# Patient Record
Sex: Male | Born: 1935 | Race: White | Hispanic: No
Health system: Southern US, Community
[De-identification: ages and names within clinical notes are randomized; demographics above are authoritative.]

---

## 2010-01-22 ENCOUNTER — Ambulatory Visit: Payer: Self-pay | Admitting: Internal Medicine

## 2010-09-11 ENCOUNTER — Ambulatory Visit: Payer: Self-pay | Admitting: Internal Medicine

## 2010-09-29 LAB — HM COLONOSCOPY

## 2011-05-04 ENCOUNTER — Ambulatory Visit: Payer: Self-pay | Admitting: Internal Medicine

## 2011-05-21 ENCOUNTER — Ambulatory Visit: Payer: Self-pay | Admitting: Gastroenterology

## 2011-05-25 LAB — PATHOLOGY REPORT

## 2011-06-15 ENCOUNTER — Ambulatory Visit: Payer: Self-pay | Admitting: Gastroenterology

## 2011-06-18 LAB — PATHOLOGY REPORT

## 2013-05-28 ENCOUNTER — Ambulatory Visit: Payer: Self-pay | Admitting: Family Medicine

## 2013-05-29 ENCOUNTER — Emergency Department: Payer: Self-pay | Admitting: Emergency Medicine

## 2013-10-25 LAB — BASIC METABOLIC PANEL
BUN: 30 mg/dL — AB (ref 4–21)
CREATININE: 1.6 mg/dL — AB (ref <=1.3)
Glucose: 100 mg/dL

## 2013-10-25 LAB — PSA: PSA: 0.7

## 2013-10-25 LAB — LIPID PANEL
CHOLESTEROL: 190 mg/dL (ref 0–200)
HDL: 87 mg/dL — AB (ref 35–70)
LDL Cholesterol: 92 mg/dL
Triglycerides: 54 mg/dL (ref 40–160)

## 2014-11-02 ENCOUNTER — Inpatient Hospital Stay: Payer: Self-pay | Admitting: Internal Medicine

## 2014-11-02 LAB — CBC
HCT: 40.1 % (ref 40.0–52.0)
HGB: 13.4 g/dL (ref 13.0–18.0)
MCH: 31.2 pg (ref 26.0–34.0)
MCHC: 33.4 g/dL (ref 32.0–36.0)
MCV: 94 fL (ref 80–100)
PLATELETS: 268 10*3/uL (ref 150–440)
RBC: 4.28 10*6/uL — AB (ref 4.40–5.90)
RDW: 13.3 % (ref 11.5–14.5)
WBC: 11.7 10*3/uL — AB (ref 3.8–10.6)

## 2014-11-02 LAB — COMPREHENSIVE METABOLIC PANEL
Albumin: 3.5 g/dL (ref 3.4–5.0)
Alkaline Phosphatase: 93 U/L (ref 46–116)
Anion Gap: 8 (ref 7–16)
BILIRUBIN TOTAL: 0.6 mg/dL (ref 0.2–1.0)
BUN: 37 mg/dL — AB (ref 7–18)
CREATININE: 1.77 mg/dL — AB (ref 0.60–1.30)
Calcium, Total: 8.4 mg/dL — ABNORMAL LOW (ref 8.5–10.1)
Chloride: 106 mmol/L (ref 98–107)
Co2: 22 mmol/L (ref 21–32)
EGFR (Non-African Amer.): 40 — ABNORMAL LOW
GFR CALC AF AMER: 48 — AB
GLUCOSE: 141 mg/dL — AB (ref 65–99)
Osmolality: 283 (ref 275–301)
Potassium: 4.8 mmol/L (ref 3.5–5.1)
SGOT(AST): 28 U/L (ref 15–37)
SGPT (ALT): 23 U/L (ref 14–63)
SODIUM: 136 mmol/L (ref 136–145)
TOTAL PROTEIN: 7.6 g/dL (ref 6.4–8.2)

## 2014-11-02 LAB — TROPONIN I
TROPONIN-I: 0.04 ng/mL
Troponin-I: 0.02 ng/mL
Troponin-I: 0.05 ng/mL

## 2014-11-02 LAB — PROTIME-INR
INR: 2.2
Prothrombin Time: 24.2 secs — ABNORMAL HIGH

## 2014-11-02 LAB — PRO B NATRIURETIC PEPTIDE: B-Type Natriuretic Peptide: 10920 pg/mL — ABNORMAL HIGH (ref 0–450)

## 2014-11-03 LAB — CBC WITH DIFFERENTIAL/PLATELET
BASOS PCT: 0.7 %
Basophil #: 0.1 10*3/uL (ref 0.0–0.1)
EOS ABS: 0.1 10*3/uL (ref 0.0–0.7)
Eosinophil %: 0.8 %
HCT: 38 % — ABNORMAL LOW (ref 40.0–52.0)
HGB: 12.7 g/dL — ABNORMAL LOW (ref 13.0–18.0)
LYMPHS PCT: 11.1 %
Lymphocyte #: 1.1 10*3/uL (ref 1.0–3.6)
MCH: 31.1 pg (ref 26.0–34.0)
MCHC: 33.5 g/dL (ref 32.0–36.0)
MCV: 93 fL (ref 80–100)
MONOS PCT: 9.7 %
Monocyte #: 1 x10 3/mm (ref 0.2–1.0)
NEUTROS ABS: 7.8 10*3/uL — AB (ref 1.4–6.5)
NEUTROS PCT: 77.7 %
Platelet: 233 10*3/uL (ref 150–440)
RBC: 4.09 10*6/uL — ABNORMAL LOW (ref 4.40–5.90)
RDW: 12.9 % (ref 11.5–14.5)
WBC: 10 10*3/uL (ref 3.8–10.6)

## 2014-11-03 LAB — BASIC METABOLIC PANEL
ANION GAP: 9 (ref 7–16)
BUN: 48 mg/dL — AB (ref 7–18)
CREATININE: 2.15 mg/dL — AB (ref 0.60–1.30)
Calcium, Total: 8.7 mg/dL (ref 8.5–10.1)
Chloride: 102 mmol/L (ref 98–107)
Co2: 25 mmol/L (ref 21–32)
EGFR (African American): 38 — ABNORMAL LOW
GFR CALC NON AF AMER: 32 — AB
Glucose: 103 mg/dL — ABNORMAL HIGH (ref 65–99)
Osmolality: 285 (ref 275–301)
Potassium: 4.3 mmol/L (ref 3.5–5.1)
Sodium: 136 mmol/L (ref 136–145)

## 2014-11-04 LAB — CREATININE, SERUM
Creatinine: 2.75 mg/dL — ABNORMAL HIGH (ref 0.60–1.30)
EGFR (African American): 29 — ABNORMAL LOW
GFR CALC NON AF AMER: 24 — AB

## 2014-11-11 ENCOUNTER — Inpatient Hospital Stay: Payer: Self-pay | Admitting: Specialist

## 2014-12-17 ENCOUNTER — Emergency Department: Payer: Self-pay | Admitting: Emergency Medicine

## 2015-01-27 NOTE — Consult Note (Signed)
PATIENT NAME:  Jaime Peters, Tobey L MR#:  [id number] DATE OF BIRTH:  06-13-1934  DATE OF CONSULTATION:  11/12/2014  REQUESTING PHYSICIAN:  Angelica Ranavid Hower, MD  CONSULTING PHYSICIAN:  Lamar BlinksBruce J. Kowalski, MD  REASON FOR CONSULTATION: Acute on chronic systolic dysfunction heart failure, chronic atrial fibrillation, chronic kidney disease needing further treatment options.   CHIEF COMPLAINT: "I'm short of breath."   HISTORY OF PRESENT ILLNESS:  This is a 81 year old male with known chronic systolic dysfunction, congestive heart failure with ejection fraction of 20% to 30% with a recent hospitalization.  The patient at that time was placed on appropriate medication management for lower extremity edema, pulmonary edema and congestive heart failure. The patient had been relatively well with treatment with carvedilol, furosemide, hydralazine and isosorbide and no ACE inhibitor was used due to concerns of chronic kidney disease. The patient therefore went home and then had some significant issues of continued lower extremity edema and shortness of breath more likely due to dietary indiscretion.  His BNP is 18,997, normal troponin without evidence of myocardial infarction.  He was placed on carvedilol for heart rate control, which appears to be relatively controlled at this time, with no evidence of significant new changes. The patient has had anticoagulation as well for further risk reduction in cardiovascular event and currently is having an INR of 2.1.  We have discussed the concerns of this 2.1 INR and whether he is taking Coumadin as well. The patient has had chronic kidney disease stage III, which is exacerbated his current issues as well.   REVIEW OF SYSTEMS: The remainder review of systems cannot be assessed due to the patient's conversation.   PAST MEDICAL HISTORY:  1.  Acute on chronic systolic dysfunction, heart failure.  2.  Chronic atrial fibrillation.  3.  Chronic kidney disease stage III.  4.   Hypertension.  5.  Mixed hyperlipidemia.   FAMILY HISTORY: Positive for shortness of breath and chest discomfort and coronary artery disease in multiple family members.   SOCIAL HISTORY: The patient currently denies alcohol or tobacco use, but has a significant history.   CURRENT MEDICATIONS:  As listed.  ALLERGIES: As listed.   PHYSICAL EXAMINATION:  VITAL SIGNS: Blood pressure is 122/68 bilateral, heart rate is 72 upright, reclining, and regular.  GENERAL: He is a well-appearing male in no acute distress.  HEENT: No icterus, thyromegaly, ulcers, hemorrhage, or xanthelasma.  CARDIOVASCULAR: Irregularly irregular. Normal S1 and S2, 2/6 apical murmur consistent with mitral regurgitation. PMI is diffuse. Carotid upstroke normal without bruit.  Jugular venous pressure is normal.  LUNGS: Have bibasilar crackles with decreased breath sounds.  ABDOMEN: Soft, nontender. Cannot assess hepatosplenomegaly or masses due to increased abdominal girth. EXTREMITIES: 2+ radial, trace femoral and dorsal pedal pulses, with 1+ lower extremity edema. No cyanosis, clubbing but some shin ulcers.  NEUROLOGIC: He is oriented to time, place, and person, with normal mood and affect.   ASSESSMENT: A 81 year old male with acute on chronic systolic dysfunction, congestive heart failure, multifactorial in nature, including dietary discretion, chronic kidney disease, and atrial fibrillation with chronic kidney disease Stage III and chronic atrial fibrillation, non-valvular in nature.   RECOMMENDATIONS:  1.  Intravenous Lasix for pulmonary edema, lower extremity edema and switch to oral Lasix when able. 2.  Continue anticoagulation for further risk reduction in stroke with chronic atrial fibrillation. The patient understands all risks and benefits of this and bleeding potential and will adjust dose based on chronic kidney disease.  3.  Further dietary evaluation  to assess for patient's dietary needs and need for  adjustments and reduce re-hospitalization.  4.   No further cardiac diagnostics at this time due to recent echocardiogram and no evidence of myocardial infarction at this time.  5.  Further ambulation and change in medication management as necessary based on above.      ____________________________ Lamar Blinks, MD bjk:DT D: 11/12/2014 08:30:22 ET T: 11/12/2014 09:23:02 ET JOB#: [id number]  cc: Lamar Blinks, MD, <Dictator> Lamar Blinks MD ELECTRONICALLY SIGNED 11/23/2014 8:30

## 2015-01-27 NOTE — H&P (Signed)
PATIENT NAME:  Jaime Peters, ELKHATIB MR#:  [id number] DATE OF BIRTH:  07-Sep-1934  DATE OF ADMISSION:  11/02/2014  REFERRING PHYSICIAN: Nez Perce Sink. Dolores Frame, MD  PRIMARY CARE PRACTITIONER: Bari Edward, MD, [facility]  PRIMARY CARDIOLOGIST: Lamar Blinks, MD   ADMITTING PHYSICIAN: Crissie Figures, MD   CHIEF COMPLAINT:  1.  Shortness of breath for the past 3 days.  2.  Productive cough for the past 3 days.   HISTORY OF PRESENT ILLNESS: A 81 year old Caucasian male with a past medical history of hypertension, chronic atrial fibrillation on Xarelto, hyperlipidemia, gastroesophageal reflux disease, renal insufficiency was in his usual state of health until 3 days ago, started having some productive cough with yellow sputum and has been noticing some increasing shortness of breath for the past 3 days. The symptoms gradually worsened with increasing shortness of breath until the last night. Hence, he called his daughter who called EMS, and EMS found the patient with shortness of breath with hypoxia and oxygen saturation of 87%. The patient was also noted to have atrial fibrillation with a rapid ventricular rate of 130 beats per minute. Hence, EMS gave Cardizem IV and gave also oxygen through nasal cannula and brought him came to the Emergency Room for further evaluation. In the Emergency Room, the patient was evaluated by the ED physician and was placed on oxygen supplementation and was still having atrial fibrillation with rapid ventricular rapid ventricular rate, was given further IV Cardizem 10 mg IV push following which he is heart rate is under control; it is stable around 105-110 beats per minute at this time. Further workup with chest x-ray with pulmonary vascular congestion and elevated BNP of 10,000. His white count is slightly elevated at 11.7. The creatinine is mildly elevated at 1.77. The patient also received  IV Lasix following which he diuresed, and he started feeling better and  currently he is on oxygen supplementation. He is comfortably resting in a chair. Denies any history of the chest pain. No fever. As mentioned, he does have some cough with expectoration of yellow sputum for the past 3 days. No nausea, no vomiting, diarrhea. No urinary symptoms.   PAST MEDICAL HISTORY:  1.  Hypertension.  2.  Chronic atrial fibrillation on Xarelto.  3.  Hyperlipidemia.  4.  Gastroesophageal reflux disease.  5.  Renal insufficiency.   PAST SURGICAL HISTORY: No history of any surgeries in the past.   ALLERGIES: No known drug allergies.   FAMILY HISTORY: Mother with congestive heart failure. Father CVA. One sister with emphysema and another sister with pancreatic cancer and son with diabetes mellitus.   SOCIAL HISTORY: He is divorced, lives alone. No history of smoking, alcohol, or drug usage.    HOME MEDICATIONS:  1.  Amlodipine/benazepril 5 mg/10 mg capsule 1 capsules once a day.  2.  Hydrochlorothiazide/triamterene 50/75 mg 1 tablet orally once a day.  3.  Lansoprazole 30 mg delayed release capsule 1 capsule orally once a day.  4.  Simvastatin 40 mg 1 tablet orally once a day.  5.  Xarelto 15 mg tablet 1 tablet orally once a day   REVIEW OF SYSTEMS:  CONSTITUTIONAL: Negative for fever. He does have some generalized fatigue with weakness for the past 2-3 days intermittently.  EYES: Negative for blurred vision, double vision. No pain. No redness. No discharge.  EARS, NOSE, AND THROAT: Negative for tinnitus, ear pain, hearing loss, epistaxis, nasal discharge.  RESPIRATORY: Positive for cough with expectoration of yellow sputum for the  past 3 days. Negative for dyspnea. Negative for wheezing. He did have some shortness of breath, as noted in the history of present illness. No hemoptysis. No painful respirations.  CARDIOVASCULAR: Negative for chest pain but positive for shortness of breath. He did have some palpitations. No dizziness. No loss of consciousness. He does have  some chronic pedal edema.  GASTROINTESTINAL: Negative for nausea, vomiting, diarrhea, abdominal pain, hematemesis, melena, or rectal bleeding. He does have some chronic GERD symptoms and takes PPI with control of symptoms.  GENITOURINARY: Negative for dysuria, frequency, urgency.  ENDOCRINE: Negative for polyuria, nocturia, heat or cold intolerance.  HEMATOLOGIC AND LYMPHATIC: Negative for anemia, easy bruising or bleeding.  INTEGUMENTARY: Negative for acne, skin rash, or lesions.  MUSCULOSKELETAL: Negative for arthritis, gout, or neck pain or back pain.  NEUROLOGICAL: Negative for focal weakness, numbness. No history of CVA, TIA, or seizure disorder.  PSYCHIATRIC: Negative for anxiety, insomnia, depression.   PHYSICAL EXAMINATION:  VITAL SIGNS: Temperature 97.8 degrees Fahrenheit; pulse rate on arrival 126 beats per minute; respirations 30 per minute; blood pressure is 127/90; oxygen saturation 87% on room air on arrival, currently at 95% on 2 liters oxygen. Heart rate is controlled at 97 per minute.  GENERAL: Well-developed, well-nourished, alert, and oriented,  no acute distress at this time, comfortably sitting in the chair.  HEAD: Atraumatic, normocephalic.  EYES: Pupils are equal, react to light and accommodation. No conjunctival pallor. Extraocular movements intact.  NOSE: No drainage. No lesions.  EARS: No drainage. No external lesions.  ORAL CAVITY: No mucosal lesions. No exudates.  NECK: Supple. No JVD. No thyromegaly. No carotid bruit. Range of motion of neck normal.  RESPIRATORY: Good respiratory effort. Not using accessory muscles of respiration. Bilateral vesicular breath sounds present, a few rales at the bases. No rhonchi.  CARDIOVASCULAR: S1, S2 irregularly irregular. No murmurs, gallops, or clicks. Peripheral pulses equal at carotid, femoral, and pedal pulses; 1+ bilateral pedal edema present.  GASTROINTESTINAL: Abdomen is soft and nontender. Obese. No hepatosplenomegaly. No  masses. No guarding. No rigidity. Bowel sounds present and equal in all 4 quadrants.  GENITOURINARY: Deferred.  MUSCULOSKELETAL: No joint tenderness or effusion. Range of motion is adequate. Strength and tone equal bilaterally.  SKIN: Inspection within normal limits.  LYMPHATIC: No cervical lymphadenopathy.  VASCULAR: 1+ dorsalis pedis and posterior tibial pulses.  NEUROLOGICAL: Alert, awake, and oriented x 3. Cranial nerves II-XII grossly intact. No sensory defect. Motor strength is 5/5 in both upper and lower extremities. DTRs 2+ bilateral and symmetrical. Plantars ongoing.  PSYCHIATRIC: Alert, awake, and oriented x 3. Judgment and insight adequate. Memory and mood within normal limits.   ANCILLARY DATA:  LABORATORY DATA: BNP 10,920. Serum glucose 141, BUN 37, creatinine 1.7, sodium 136, potassium 4.8, chloride 106, bicarbonate 22. Total calcium 8.4, total protein 7.6, albumin 3.5, total bilirubin 0.6, alkaline phosphatase 93, AST 28, ALT 23. Troponin 0.02. WBC 11.7, hemoglobin 13.4, hematocrit 40.1, platelet count 268,000. Prothrombin time 24.2, INR 2.2.   IMAGING: Chest x-ray vascular congestion noted. Increased interstitial markings with bibasilar airspace opacities raise concern for pulmonary edema. Small bilateral pleural effusions noted, more prominent on the right.   EKG: Atrial fibrillation with rapid ventricular rate of 120 beats per minute. No acute ST-T changes.   ASSESSMENT AND PLAN: A 81 year old Caucasian male with a past medical history of hypertension, chronic atrial fibrillation on Xarelto, hyperlipidemia, gastroesophageal reflux disease, renal insufficiency presents with the complaints of shortness of breath, cough with productive sputum of 3 days' duration,  found to have atrial fibrillation with rapid ventricular rate by EMS and hypoxia with oxygen saturations of 87% on room air, was given intravenous Cardizem by EMS and brought to the Emergency Room for further evaluation.  1.   Acute respiratory failure with hypoxia secondary to acute congestive heart failure, systolic versus diastolic.  2.  Acute congestive heart failure, systolic versus diastolic.  PLAN: Admit to telemetry. Oxygen supplementation, intravenous furosemide, aspirin, nitrate, beta blocker. Continue ACEI. Cycle cardiac enzymes. Echocardiogram. Cardiology consultation.  3.  Atrial fibrillation with rapid ventricular rate: History of chronic atrial fibrillation on Xarelto. The patient received intravenous Cardizem. Currently rate under control. We will start low-dose beta blocker. Monitor heart rate, follow up echocardiogram, and cardiology consult.  4.  Cough with expectoration for the past 3 days. White blood cell mild elevation. Chest x-ray pulmonary congestion, pneumonia could not be ruled out, likely acute bronchitis, rule out pneumonia. PLAN: Sputum cultures. Start intravenous antibiotics, Levaquin and follow up clinically.  5.  Hypertension: Controlled on home medications. Continue same.  6.  Hyperlipidemia: Stable on statin. Continue same.  7.  Renal insufficiency, mild: The patient is stable. Monitor clinically and monitor BMP.  8.  Chronic anticoagulation using Xarelto: Stable. Continue same.  9.  Gastroesophageal reflux disease: Stable on proton pump inhibitor. Continue same.  10.  Deep vein thrombosis prophylaxis: Xarelto. 11.  Gastrointestinal prophylaxis: Proton pump inhibitor.   CODE STATUS: Full code.   TIME SPENT: 55 minutes.    ____________________________ Crissie Figures, MD enr:bm D: 11/02/2014 03:36:30 ET T: 11/02/2014 04:17:58 ET JOB#: [id number]  cc: Crissie Figures, MD, <Dictator> Bari Edward, MD Crissie Figures MD ELECTRONICALLY SIGNED 11/02/2014 17:18

## 2015-01-27 NOTE — Discharge Summary (Signed)
PATIENT NAME:  Jaime Peters, Jaime Peters MR#:  [id number] DATE OF BIRTH:  03-13-1934  DATE OF ADMISSION:  11/11/2014 DATE OF DISCHARGE:  11/13/2014  ADMITTING DIAGNOSIS:  Shortness of breath.   DISCHARGE DIAGNOSES: 1.  Acute-on-chronic respiratory failure due to acute-on-chronic systolic congestive heart failure exacerbation.  2.  Chronic atrial fibrillation.  3.  Chronic kidney disease stage III, outpatient nephrology followup.  4.  Gastroesophageal reflux disease.  5.  Hyperlipidemia.  6.  Hypertension.  7.  Chronic respiratory failure requiring continuous oxygen.   CONSULTANTS:  Lamar BlinksBruce J. Kowalski, MD   PERTINENT LABS AND EVALUATIONS:  Admitting glucose was 116, BUN 49, creatinine 1.65, sodium 136, potassium 5.7, chloride 102, CO2 of 25, calcium 8.8. LFTs were normal except albumin of 3.1. AST was 45. Troponin was less than 0.02. WBC was 10.6, hemoglobin 11.4, platelet count 251,000. INR was 2.1. ABG:  PH of 7.34, pCO2 of 49, and pO2 of 38. EKG showed atrial fibrillation with rapid ventricular response. Chest x-ray showed worsening interstitial edema and small bilateral effusions.   HOSPITAL COURSE:  Please refer to H and P done by the admitting physician. The patient is a 81 year old white male with history of systolic CHF, presenting with shortness of breath. The patient was noted to be in acute systolic CHF. He was treated with IV Lasix. The patient started to improve. He had digoxin added to his regimen. He was also seen by his cardiologist, who recommended continuing current therapy. The patient previously was discharged on oxygen, but he was only wearing it when his saturation dropped. I strongly recommended that he wear continuous oxygen. He is also referred to the CHF clinic, and he will have home health and PT come evaluate the patient. At this time, he is doing much better and is stable for discharge. The patient stated that he wanted to go home.   DISCHARGE MEDICATIONS:  Xarelto 15 one tab  p.o. daily, simvastatin 40 at bedtime, lansoprazole 30 two daily, isosorbide mononitrate 30 daily, carvedilol 6.25 one tab p.o. b.i.d., Lasix 20 once a day, lisinopril 2.5 p.o. daily, Aldactone 25 mg 1 tab p.o. b.i.d., digoxin 125 mcg 1 tab p.o. daily.   HOME OXYGEN:  Yes, 3 liters via nasal cannula.   DIET:  Low sodium, low fat, low cholesterol.   ACTIVITY:  As tolerated.   FOLLOWUP:  With primary MD in 1 to 2 weeks, with Dr. Gwen PoundsKowalski in 1 to 2 weeks, with Dr. Thedore MinsSingh in 2 to 4 weeks, and with CHF clinic.   TIME SPENT:  35 minutes on this patient.    ____________________________ Lacie ScottsShreyang H. Allena KatzPatel, MD shp:nb D: 11/13/2014 20:53:45 ET T: 11/14/2014 05:47:51 ET JOB#: [id number]  cc: Andres Escandon H. Allena KatzPatel, MD, <Dictator> Charise CarwinSHREYANG H Ziyah Cordoba MD ELECTRONICALLY SIGNED 11/16/2014 15:56

## 2015-01-27 NOTE — Consult Note (Signed)
Chief Complaint:  Subjective/Chief Complaint Shortness of breath improve swelling improved denies any chest pain denies any palpitations or tachycardia   VITAL SIGNS/ANCILLARY NOTES: **Vital Signs.:   06-Feb-16 11:34  Vital Signs Type Routine  Temperature Temperature (F) 97.7  Celsius 36.5  Temperature Source oral  Pulse Pulse 78  Respirations Respirations 18  Systolic BP Systolic BP 135  Diastolic BP (mmHg) Diastolic BP (mmHg) 75  Mean BP 95  Pulse Ox % Pulse Ox % 95  Pulse Ox Activity Level  At rest  Oxygen Delivery 1L  *Intake and Output.:   06-Feb-16 08:29  Grand Totals Intake:  240 Output:  200    Net:  40 24 Hr.:  40  Oral Intake      In:  240  Percentage of Meal Eaten  75  Urine ml     Out:  200  Urinary Method  Void; Urinal   Brief Assessment:  GEN well developed, well nourished, no acute distress, disheveled   Cardiac Irregular  murmur present  + LE edema   Respiratory normal resp effort  rhonchi  crackles   Gastrointestinal Normal   Gastrointestinal details normal Soft   Lab Results: Routine Chem:  06-Feb-16 05:18   Glucose, Serum  103  BUN  48  Creatinine (comp)  2.15  Sodium, Serum 136  Potassium, Serum 4.3  Chloride, Serum 102  CO2, Serum 25  Calcium (Total), Serum 8.7  Anion Gap 9  Osmolality (calc) 285  eGFR (African American)  38  eGFR (Non-African American)  32 (eGFR values <60mL/min/1.73 m2 may be an indication of chronic kidney disease (CKD). Calculated eGFR, using the MRDR Study equation, is useful in  patients with stable renal function. The eGFR calculation will not be reliable in acutely ill patients when serum creatinine is changing rapidly. It is not useful in patients on dialysis. The eGFR calculation may not be applicable to patients at the low and high extremes of body sizes, pregnant women, and vegetarians.)  Routine Hem:  06-Feb-16 05:18   WBC (CBC) 10.0  RBC (CBC)  4.09  Hemoglobin (CBC)  12.7  Hematocrit (CBC)   38.0  Platelet Count (CBC) 233  MCV 93  MCH 31.1  MCHC 33.5  RDW 12.9  Neutrophil % 77.7  Lymphocyte % 11.1  Monocyte % 9.7  Eosinophil % 0.8  Basophil % 0.7  Neutrophil #  7.8  Lymphocyte # 1.1  Monocyte # 1.0  Eosinophil # 0.1  Basophil # 0.1 (Result(s) reported on 03 Nov 2014 at 06:06AM.)   Radiology Results: XRay:    05-Feb-16 01:08, Chest Portable Single View  Chest Portable Single View   REASON FOR EXAM:    SOB  COMMENTS:       PROCEDURE: DXR - DXR PORTABLE CHEST SINGLE VIEW  - Nov 02 2014  1:08AM     CLINICAL DATA:  Acute onset of chest pressure and marked shortness  of breath. Tachycardia and decreased O2 saturation. Initial  encounter.    EXAM:  PORTABLE CHEST - 1 VIEW    COMPARISON:  Chest radiograph performed 05/29/2013    FINDINGS:  The lungs are well-aerated. Vascular congestion is noted. Bilateral  increased interstitial markings, with bibasilar airspace opacities,  raiseconcern for pulmonary edema, with small bilateral pleural  effusions, more prominent on the right. No pneumothorax is seen.    The cardiomediastinal silhouette is borderline normal in size. No  acute osseous abnormalities are seen.     IMPRESSION:  Vascular congestion noted.   Increased interstitial markings, with  bibasilar airspace opacities, raise concern for pulmonary edema.  Small bilateral pleural effusions noted, more prominent on the  right.    Electronically Signed    By: Garald Balding M.D.    On: 11/02/2014 02:09         Verified By: JEFFREY . Radene Knee, M.D.,    06-Feb-16 09:30, Chest PA and Lateral  Chest PA and Lateral   REASON FOR EXAM:    chf  COMMENTS:       PROCEDURE: DXR - DXR CHEST PA (OR AP) AND LATERAL  - Nov 03 2014  9:30AM     CLINICAL DATA:  Shortness of breath and history of CHF.    EXAM:  CHEST  2 VIEW    COMPARISON:  11/02/2014    FINDINGS:  Lungs are adequately inflated with interval improvement in the  perihilar markings. There is  persistent mild bibasilar opacification  compatible with small effusions with associated atelectasis and less  likely infection. Cardiomediastinal silhouette and remainder the  exam is unchanged.     IMPRESSION:  Near complete resolution of the previous node interstitial edema.  Continued small bilateral pleural effusions likely associated  atelectasis and less likely infection in the lung bases.      Electronically Signed    By: Marin Olp M.D.    On: 11/03/2014 10:01       Verified By: Pearletha Alfred, M.D.,  Cardiology:    05-Feb-16 00:48, ECG  Ventricular Rate 119  Atrial Rate 108  QRS Duration 106  QT 318  QTc 447  R Axis -50  T Axis 90  ECG interpretation   Atrial fibrillation with rapid ventricular response with premature ventricular or aberrantly conducted complexes  Left anterior fascicular block  Septal infarct , age undetermined  Abnormal ECG  No previous ECGs available  ----------unconfirmed----------  Confirmed by OVERREAD, NOT ([id number]), editor PEARSON, BARBARA ([id number]) on 11/02/2014 1:56:33 PM  ECG     05-Feb-16 08:28, Echo Doppler  Echo Doppler   REASON FOR EXAM:      COMMENTS:       PROCEDURE: [facility] - ECHO DOPPLER COMPLETE(TRANSTHOR)  - Nov 02 2014  8:28AM     RESULT: Echocardiogram Report    Patient Name:   Jaime Peters Date of Exam: 11/02/2014  Medical Rec #:  [id number]           Custom1:  Date of Birth:  [birth year]        Height:       68.0 in  Patient Age:    81 years         Weight:       224.0 lb  Patient Gender: M                BSA:          2.14 m??    Indications: CHF  Sonographer:    Sherrie Sport RDCS  Referring Phys: Azucena Freed, N    Sonographer Comments: Technically difficult study due to poor echo   windows.    Summary:   1. Left ventricular ejection fraction, by visual estimation, is 20 to   25%.   2. Severely decreased global left ventricular systolic function.   3. Moderately increased left ventricular internal cavity size.   4.  Moderately reduced RV systolic function.   5. Moderately dilated left atrium.   6. Mildly dilated right atrium.   7. Moderate mitral valve regurgitation.  8. Mild to moderate tricuspid regurgitation.   9. Mildly increased left ventricular posterior wall thickness.  2D AND M-MODE MEASUREMENTS (normal ranges within parentheses):  Left Ventricle:          Normal  IVSd (2D):      1.03 cm (0.7-1.1)  LVPWd (2D):     1.29 cm (0.7-1.1) Aorta/LA:                  Normal  LVIDd (2D):     4.95 cm (3.4-5.7) Aortic Root (2D): 2.90 cm (2.4-3.7)  LVIDs (2D):     4.50 cm           Left Atrium (2D): 5.00 cm (1.9-4.0)  LV FS (2D):      9.1 %   (>25%)  LV EF (2D):     20.0 %   (>50%)                    Right Ventricle:                                    RVd (2D):        8.33 cm  LV DIASTOLIC FUNCTION:  MV Peak E: 1.25 m/s E/e' Ratio: 19.40                      Decel Time: 137 msec  SPECTRAL DOPPLER ANALYSIS (where applicable):  Mitral Valve:  MV P1/2 Time: 39.73 msec  MV Area, PHT: 5.54 cm??  Aortic Valve: AoV Max Vel: 0.83 m/s AoV Peak PG: 2.7 mmHg AoV Mean PG:  LVOT Vmax: 0.60 m/s LVOT VTI:  LVOT Diameter: 2.00 cm  AoV Area, Vmax: 2.27 cm?? AoV Area, VTI:  AoV Area, Vmn:  Tricuspid Valve and PA/RV Systolic Pressure: TR Max Velocity: 2.95 m/s RA   Pressure: 5 mmHg RVSP/PASP: 39.8 mmHg  Pulmonic Valve:  PV Max Velocity: 0.91 m/s PV Max PG: 3.3 mmHg PV Mean PG:    PHYSICIAN INTERPRETATION:  Left Ventricle: The left ventricular internal cavity size was moderately   increased. LV septal wall thickness was normal. LV posterior wall   thickness was mildly increased. Global LV systolic function was severely     decreased. Left ventricular ejection fraction, by visual estimation, is   20 to 25%.  Right Ventricle: The right ventricular size is mildly enlarged. Global RV   systolic function is moderately reduced.  Left Atrium: The left atrium is moderately dilated.  Right Atrium: The right atrium is  mildly dilated.  Pericardium: There is no evidence of pericardial effusion.  Mitral Valve: The mitral valve is normal in structure. Moderate mitral   valve regurgitation is seen.  Tricuspid Valve: The tricuspid valve is normal. Mild to moderate   tricuspid regurgitation is visualized. The tricuspid regurgitant velocity   is 2.95 m/s, and with an assumed right atrial pressure of 5 mmHg, the   estimated right ventricular systolic pressure is normal at 39.8 mmHg.  Aortic Valve: The aortic valve is normal. No evidence of aortic valve   regurgitation is seen.  Pulmonic Valve: The pulmonic valve is normal. Trace pulmonic valve   regurgitation.    Republic MD  Electronically signed by Kingman Lujean Amel MD  Signature Date/Time: 11/02/2014/4:02:44 PM    *** Final ***    IMPRESSION: .        Verified By: Yolonda Kida, M.D., MD  Assessment/Plan:  Assessment/Plan:  Assessment congestive heart failure  cardiomyopathy  atrial fibrillation chronic  leg edema  respiratory failure acute on chronic  bronchitis  GERD  weakness  hypoxemia  acute on chronic renal insufficiency .   Plan continue diuretic therapy for congestive heart failure symptoms  continue with beta-blockade therapy as well as ARB  consider hydralazine Imdur or for heart failure therapy  continue Xarelto for anticoagulation for AFib  continue rate control for atrial fibrillation consider antiarrhythmic  recommend support stockings for lower extremities  recommend inhalers for COPD bronchitis symptoms  supplemental oxygen for hypoxemia  physical therapy  do not recommend cardiac catheterization at this point will recommend medical therapy at this stage   Electronic Signatures: Callwood, Dwayne D (MD)  (Signed 06-Feb-16 15:56)  Authored: Chief Complaint, VITAL SIGNS/ANCILLARY NOTES, Brief Assessment, Lab Results, Radiology Results, Assessment/Plan   Last Updated: 06-Feb-16 15:56 by Callwood,  Dwayne D (MD) 

## 2015-01-27 NOTE — Discharge Summary (Signed)
PATIENT NAME:  Jaime Peters, Jaime Peters MR#:  [id number] DATE OF BIRTH:  11-Aug-1934  DATE OF ADMISSION:  11/02/2014 DATE OF DISCHARGE:  11/04/2014  PRESENTING COMPLAINT: Shortness of breath.   DISCHARGE DIAGNOSES: 1.  Acute systolic congestive heart failure.  2.  Chronic atrial fibrillation, on anticoagulation.  3.  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 4.  Hypertension.   CONDITION ON DISCHARGE: Fair. Sats 93% on 2 liters, 85% on room air with ambulation.   HOME OXYGEN: 2 liters nasal cannula, continuous.   CODE STATUS: FULL code.   DISCHARGE MEDICATIONS: 1.  Xarelto 15 mg p.o. daily.  2.  Simvastatin 40 mg daily.  3.  Lansoprazole 30 mg p.o. daily.  4.  Imdur 30 mg p.o. daily.  5.  Carvedilol 6.25 mg b.i.d.  6.  Augmentin 500/125 one tablet b.i.d.   DISCHARGE INSTRUCTIONS: 1.  The patient recommended not to take hydrochlorothiazide/triamterene and amlodipine/benazepril.  2.   Home health PT, RN.  3.  Follow up with Dr. Judithann GravesBerglund next week, Tuesday or Wednesday.  4.  Follow up with Dr. Gwen PoundsKowalski on your February appointment.  5.  The patient to see Dr. Judithann GravesBerglund to check metabolic panel on Tuesday or Wednesday. The patient's last creatinine was 2.7 on February 7th.  DIAGNOSTIC DATA: Remaining labs: H and H 10.2 and 38. Potassium 4.3.   Echo Doppler showed EF of 20% to 25%, severely decreased LVEF, moderately reduced RV systolic function, moderately dilated left atrium and right atrium.   Cardiac enzymes negative.   Chest x-ray consistent with pulmonary edema.   BNP was 10,920.   EKG showed atrial fibrillation.   CONSULTATIONS: Cardiology Dr. Juliann Paresallwood.   BRIEF SUMMARY OF HOSPITAL COURSE: Jaime Peters is a [age] year old Caucasian gentleman with history of chronic A-fib, on Coumadin, hyperlipidemia and gastric ulcer admitted with:  1.  Acute hypoxic respiratory failure secondary to acute pulmonary edema on chest x-ray, likely new onset CHF, systolic. Echo showed EF of 20% to  25%. He received IV Lasix. He diuresed well. The patient ambulated and de-satted to 85% on room air, hence oxygen was arranged. He will follow up with Dr. Gwen PoundsKowalski on his scheduled appointment.  2.  Acute on chronic renal insufficiency, unknown baseline. The patient came in with creatinine 1.7; at discharge creatinine was 2.7, likely due to overdiuresis. The patient's hydrochlorothiazide and benazepril were discontinued. He was placed on Imdur and hydralazine. He will follow up with Dr. Judithann GravesBerglund to followup with his metabolic panel. Lasix was not prescribed.  3.  Hypertension. On Imdur, hydralazine and Coreg.  4.  Acute bronchitis. Will finish a course with Augmentin.  5.  Chronic A-fib with RVR. Rate controlled with Coreg. He was continued on Xarelto.  6.  History of gastric ulcer. The patient was continued on PPI.  7.  Home health PT was arranged.   The discharge plan was discussed with the patient's daughter.   TIME SPENT: 40 minutes. ____________________________ Wylie HailSona A. Allena KatzPatel, MD sap:sb D: 11/05/2014 07:16:43 ET T: 11/05/2014 08:15:06 ET JOB#: [id number]  cc: Henretter Piekarski A. Allena KatzPatel, MD, <Dictator> Bari EdwardLaura Berglund, MD Lamar BlinksBruce J. Kowalski, MD Willow OraSONA A Dmauri Rosenow MD ELECTRONICALLY SIGNED 11/15/2014 23:26

## 2015-01-27 NOTE — Consult Note (Signed)
PATIENT NAME:  Jaime Peters, Jaime Peters DATE OF BIRTH:  1934/09/04  DATE OF CONSULTATION:  11/02/2014  REFERRING PHYSICIAN:  Crissie FiguresEdavally N. Reddy, MD  CONSULTING PHYSICIAN:  Sophy Mesler D. Juliann Paresallwood, MD  PRIMARY PHYSICIAN: Bari EdwardLaura Berglund, MD.  CARDIOLOGIST: Lamar BlinksBruce J. Kowalski, MD.   INDICATION: Congestive heart failure, shortness of breath, atrial fibrillation.  HISTORY OF PRESENT ILLNESS: Jaime Peters is a 81 year old white male with a past medical history of hypertension, chronic atrial fibrillation on Xarelto, hyperlipidemia, reflux, renal insufficiency. Usual state of health until about 3 days ago, then started having productive cough, yellow sputum, shortness of breath, dyspnea. Patient states symptoms gradually got worse until the night previous to admission he called his daughter, who called EMS. Found the patient short of breath, hypoxic saturations in the 80s. The patient was also noted to have atrial fibrillation with rapid ventricular response, rate of 130. Given IV Cardizem and oxygen and brought to the Emergency Room. Placed on supplemental oxygen. Still in atrial fibrillation with rapid ventricular response. Patient was given more Cardizem. We were able to get his rate to about 100 , vascular congestion on chest x-ray. BNP of 10,000. White count was about 12. Creatinine was slightly elevated at 1.7. Given IV Lasix and supplemental oxygen and states he had some improvement. Denied any fever, chills, or sweats. Has had yellow sputum for 2 days. No significant nausea or vomiting.    PAST MEDICAL HISTORY: Hypertension, chronic atrial fibrillation, hyperlipidemia, reflux, renal insufficiency.   PAST SURGICAL HISTORY: None.  ALLERGIES: None.   FAMILY HISTORY: Congestive heart failure, CVA, emphysema, pancreatic cancer, diabetes.   SOCIAL HISTORY: Divorced, lives alone. No smoking. No alcohol consumption.  HOME MEDICATIONS: Amlodipine and benazepril 5/10, 1 tablet a day; HCTZ and  triamterene 50/75 once a day; lansoprazole 30 mg once a day; simvastatin 40 a day; Xarelto 15 mg a day.   REVIEW OF SYSTEMS: Denies any blackouts or syncope. No nausea or vomiting. No fever. No chills. No sweats. No weight loss. No weight gain. No hemoptysis or hematemesis. No bright red blood per rectum. He is complaining of congestion, shortness of breath, sputum production, weakness, fatigue.   PHYSICAL EXAMINATION:  VITAL SIGNS: Blood pressure 130/90; pulse of 120, irregular; respiratory rate 26; afebrile.  HEENT: Normocephalic, atraumatic. Pupils equal and reactive to light.  NECK: Supple. No significant JVD, bruits or adenopathy.  LUNGS: Clear to auscultation and percussion with mild rhonchi in the bases. Faint dry crackles.  HEART: Irregularly irregular. Systolic ejection murmur at sternal border. PMI nondisplaced.  ABDOMEN: Benign.  EXTREMITY: Within normal limits except for 1 to 2+ edema. NEUROLOGIC: Intact.  SKIN: Normal.   LABORATORY DATA: BNP was 11,000. Glucose 141, BUN 37, creatinine 1.7, sodium 136, potassium 4.8, chloride 106, bicarbonate 22, total calcium 8.4, total protein  LFTs were essentially negative. Troponin 0.02. White count of 11.7, hemoglobin 15.4, hematocrit 40, platelet count 268,000. PT 24, INR 2.2. Chest x-ray showed vascular congestion, increased markings. No significant pneumonia. Small effusion on the right.   EKG: Atrial fibrillation, rapid ventricular response, rate of about 120, nonspecific finding.   ASSESSMENT: Congestive heart failure, mild respiratory failure, atrial fibrillation, cough, hypertension, hyperlipidemia, renal insufficiency, coagulopathy, reflux.   PLAN: 1.  Agree with admit. Rule out for myocardial infarction. Follow up cardiac enzymes. Follow up EKG. Consider echocardiogram. Consider diuresis. Continue current medications. 2.  Respiratory failure with hypoxemia. Recommend supplemental oxygen and inhalers. Consider pulmonary input.  Consider antibiotic therapy. Consider steroids. Continue diuretics. 3. Congestive  heart failure with systolic versus diastolic. Echocardiogram would be helpful for further evaluation and monitoring as well as tailoring care. Meanwhile, recommend an ACE inhibitor as well as beta blocker therapy and diuretics. Atrial fibrillation, continue rate control with intravenous Cardizem, low-dose beta blocker, and consider antiarrhythmic. Continue anticoagulants with Xarelto for now.  4.  For cough, recommend cough medication including expectorant. Consider antibiotic therapy. Continue inhalers. Recommend cultures for further evaluation and management. Antibiotics probably with Levaquin. 5.  Hypertension. Continue blood pressure control as necessary. For hyperlipidemia, recommend continued statin therapy.  6.  Renal insufficiency. Consider evaluation by nephrology for stage 4 renal insufficiency. Avoid nephrotoxic drugs in the meantime. 7.  Reflux. Continue proton pump inhibitor.  8.  Deep vein thrombosis prophylaxis. The patient is already on Xarelto. Will continue current therapy. Continue to watch the patient. Treat conservatively but aggressively for heart failure and respiratory failure and see how the patient responds.   ____________________________ Bobbie Stack Juliann Pares, MD ddc:ST D: 11/02/2014 20:36:51 ET T: 11/03/2014 00:17:16 ET JOB#: [id number]  cc: Shaquisha Wynn D. Juliann Pares, MD, <Dictator> Alwyn Pea MD ELECTRONICALLY SIGNED 11/06/2014 17:40

## 2015-01-27 NOTE — Consult Note (Signed)
Chief Complaint:  Subjective/Chief Complaint Shortness of breath improved 2 improve swelling still weak but feels better denies worsening palpitations or tachycardia feels well enough that   VITAL SIGNS/ANCILLARY NOTES: **Vital Signs.:   07-Feb-16 07:32  Vital Signs Type Routine  Temperature Temperature (F) 97.4  Celsius 36.3  Temperature Source oral  Pulse Pulse 76  Respirations Respirations 19  Systolic BP Systolic BP 671  Diastolic BP (mmHg) Diastolic BP (mmHg) 75  Mean BP 92  Pulse Ox % Pulse Ox % 96  Pulse Ox Activity Level  At rest  Oxygen Delivery 1L  Oxygen Delivery Adjusted To (RN or RCP Only)  Room Air/ 21 %  *Intake and Output.:   Daily 07-Feb-16 07:00  Grand Totals Intake:  720 Output:  1100    Net:  -380 24 Hr.:  -380  Oral Intake      In:  720  Urine ml     Out:  1100  Length of Stay Totals Intake:  820 Output:  4400    Net:  -3580   Brief Assessment:  GEN well developed, well nourished, no acute distress   Cardiac Irregular  murmur present  + LE edema  -- JVD   Respiratory normal resp effort  rhonchi   Gastrointestinal Normal   Gastrointestinal details normal Soft   EXTR negative cyanosis/clubbing, negative edema   Lab Results: Routine Chem:  06-Feb-16 05:18   Creatinine (comp)  2.15  eGFR (African American)  38  eGFR (Non-African American)  32 (eGFR values <34m/min/1.73 m2 may be an indication of chronic kidney disease (CKD). Calculated eGFR, using the MRDR Study equation, is useful in  patients with stable renal function. The eGFR calculation will not be reliable in acutely ill patients when serum creatinine is changing rapidly. It is not useful in patients on dialysis. The eGFR calculation may not be applicable to patients at the low and high extremes of body sizes, pregnant women, and vegetarians.)  Glucose, Serum  103  BUN  48  Sodium, Serum 136  Potassium, Serum 4.3  Chloride, Serum 102  CO2, Serum 25  Calcium (Total), Serum 8.7   Anion Gap 9  Osmolality (calc) 285  Routine Hem:  06-Feb-16 05:18   WBC (CBC) 10.0  RBC (CBC)  4.09  Hemoglobin (CBC)  12.7  Hematocrit (CBC)  38.0  Platelet Count (CBC) 233  MCV 93  MCH 31.1  MCHC 33.5  RDW 12.9  Neutrophil % 77.7  Lymphocyte % 11.1  Monocyte % 9.7  Eosinophil % 0.8  Basophil % 0.7  Neutrophil #  7.8  Lymphocyte # 1.1  Monocyte # 1.0  Eosinophil # 0.1  Basophil # 0.1 (Result(s) reported on 03 Nov 2014 at 0[facility])   Radiology Results: XRay:    05-Feb-16 01:08, Chest Portable Single View  Chest Portable Single View   REASON FOR EXAM:    SOB  COMMENTS:       PROCEDURE: DXR - DXR PORTABLE CHEST SINGLE VIEW  - Nov 02 2014  1:08AM     CLINICAL DATA:  Acute onset of chest pressure and marked shortness  of breath. Tachycardia and decreased O2 saturation. Initial  encounter.    EXAM:  PORTABLE CHEST - 1 VIEW    COMPARISON:  Chest radiograph performed 05/29/2013    FINDINGS:  The lungs are well-aerated. Vascular congestion is noted. Bilateral  increased interstitial markings, with bibasilar airspace opacities,  raiseconcern for pulmonary edema, with small bilateral pleural  effusions, more prominent on the  right. No pneumothorax is seen.    The cardiomediastinal silhouette is borderline normal in size. No  acute osseous abnormalities are seen.     IMPRESSION:  Vascular congestion noted. Increased interstitial markings, with  bibasilar airspace opacities, raise concern for pulmonary edema.  Small bilateral pleural effusions noted, more prominent on the  right.    Electronically Signed    By: Garald Balding M.D.    On: 11/02/2014 02:09         Verified By: JEFFREY . Radene Knee, M.D.,    06-Feb-16 09:30, Chest PA and Lateral  Chest PA and Lateral   REASON FOR EXAM:    chf  COMMENTS:       PROCEDURE: DXR - DXR CHEST PA (OR AP) AND LATERAL  - Nov 03 2014  9:30AM     CLINICAL DATA:  Shortness of breath and history of CHF.    EXAM:  CHEST  2  VIEW    COMPARISON:  11/02/2014    FINDINGS:  Lungs are adequately inflated with interval improvement in the  perihilar markings. There is persistent mild bibasilar opacification  compatible with small effusions with associated atelectasis and less  likely infection. Cardiomediastinal silhouette and remainder the  exam is unchanged.     IMPRESSION:  Near complete resolution of the previous node interstitial edema.  Continued small bilateral pleural effusions likely associated  atelectasis and less likely infection in the lung bases.      Electronically Signed    By: Marin Olp M.D.    On: 11/03/2014 10:01       Verified By: Pearletha Alfred, M.D.,  Cardiology:    05-Feb-16 00:48, ECG  Ventricular Rate 119  Atrial Rate 108  QRS Duration 106  QT 318  QTc 447  R Axis -50  T Axis 90  ECG interpretation   Atrial fibrillation with rapid ventricular response with premature ventricular or aberrantly conducted complexes  Left anterior fascicular block  Septal infarct , age undetermined  Abnormal ECG  No previous ECGs available  ----------unconfirmed----------  Confirmed by OVERREAD, NOT ([id number]), editor PEARSON, BARBARA ([id number]) on 11/02/2014 1:56:33 PM  ECG     05-Feb-16 08:28, Echo Doppler  Echo Doppler   REASON FOR EXAM:      COMMENTS:       PROCEDURE: [facility] - ECHO DOPPLER COMPLETE(TRANSTHOR)  - Nov 02 2014  8:28AM     RESULT: Echocardiogram Report    Patient Name:   Jaime Peters Date of Exam: 11/02/2014  Medical Rec #:  [id number]           Custom1:  Date of Birth:  19-Jun-1934        Height:       68.0 in  Patient Age:    81 years         Weight:       224.0 lb  Patient Gender: M                BSA:          2.14 m??    Indications: CHF  Sonographer:    Sherrie Sport RDCS  Referring Phys: Azucena Freed, N    Sonographer Comments: Technically difficult study due to poor echo   windows.    Summary:   1. Left ventricular ejection fraction, by visual estimation, is 20 to    25%.   2. Severely decreased global left ventricular systolic function.   3. Moderately increased left ventricular internal  cavity size.   4. Moderately reduced RV systolic function.   5. Moderately dilated left atrium.   6. Mildly dilated right atrium.   7. Moderate mitral valve regurgitation.   8. Mild to moderate tricuspid regurgitation.   9. Mildly increased left ventricular posterior wall thickness.  2D AND M-MODE MEASUREMENTS (normal ranges within parentheses):  Left Ventricle:          Normal  IVSd (2D):      1.03 cm (0.7-1.1)  LVPWd (2D):     1.29 cm (0.7-1.1) Aorta/LA:                  Normal  LVIDd (2D):     4.95 cm (3.4-5.7) Aortic Root (2D): 2.90 cm (2.4-3.7)  LVIDs (2D):     4.50 cm           Left Atrium (2D): 5.00 cm (1.9-4.0)  LV FS (2D):      9.1 %   (>25%)  LV EF (2D):     20.0 %   (>50%)                    Right Ventricle:                                    RVd (2D):        6.23 cm  LV DIASTOLIC FUNCTION:  MV Peak E: 1.25 m/s E/e' Ratio: 19.40                      Decel Time: 137 msec  SPECTRAL DOPPLER ANALYSIS (where applicable):  Mitral Valve:  MV P1/2 Time: 39.73 msec  MV Area, PHT: 5.54 cm??  Aortic Valve: AoV Max Vel: 0.83 m/s AoV Peak PG: 2.7 mmHg AoV Mean PG:  LVOT Vmax: 0.60 m/s LVOT VTI:  LVOT Diameter: 2.00 cm  AoV Area, Vmax: 2.27 cm?? AoV Area, VTI:  AoV Area, Vmn:  Tricuspid Valve and PA/RV Systolic Pressure: TR Max Velocity: 2.95 m/s RA   Pressure: 5 mmHg RVSP/PASP: 39.8 mmHg  Pulmonic Valve:  PV Max Velocity: 0.91 m/s PV Max PG: 3.3 mmHg PV Mean PG:    PHYSICIAN INTERPRETATION:  Left Ventricle: The left ventricular internal cavity size was moderately   increased. LV septal wall thickness was normal. LV posterior wall   thickness was mildly increased. Global LV systolic function was severely     decreased. Left ventricular ejection fraction, by visual estimation, is   20 to 25%.  Right Ventricle: The right ventricular size is mildly  enlarged. Global RV   systolic function is moderately reduced.  Left Atrium: The left atrium is moderately dilated.  Right Atrium: The right atrium is mildly dilated.  Pericardium: There is no evidence of pericardial effusion.  Mitral Valve: The mitral valve is normal in structure. Moderate mitral   valve regurgitation is seen.  Tricuspid Valve: The tricuspid valve is normal. Mild to moderate   tricuspid regurgitation is visualized. The tricuspid regurgitant velocity   is 2.95 m/s, and with an assumed right atrial pressure of 5 mmHg, the   estimated right ventricular systolic pressure is normal at 39.8 mmHg.  Aortic Valve: The aortic valve is normal. No evidence of aortic valve   regurgitation is seen.  Pulmonic Valve: The pulmonic valve is normal. Trace pulmonic valve   regurgitation.    [place]  Electronically signed by [phone]  Lujean Amel MD  Signature Date/Time: 11/02/2014/4:02:44 PM    *** Final ***    IMPRESSION: .        Verified By: Yolonda Kida, M.D., MD   Assessment/Plan:  Assessment/Plan:  Assessment congestive heart failure  cardiomyopathy severe systolic  hypertension  renal insufficiency acute on chronic  atrial fibrillation  hypoxemia  leg edema  bronchitis .   Plan continue heart failure therapy  discontinue ACE-inhibitor and use hydralazine and Imdur   have the patient follow-up with Nephrology for renal insufficiency  hold diuretic therapy because of worsening renal insufficiency  rate control for atrial fibrillation  continue Xarelto for anticoagulation for AFib  continue inhalers for COPD bronchitis  recommend support stockings for edema  physical therapy for rehab  patient should be acute may be discharged today for further follow-up  have the patient follow up with regular cardiologist 1-2 weeks   Electronic Signatures: Lujean Amel D (MD)  (Signed 08-Feb-16 11:23)  Authored: Chief Complaint, VITAL SIGNS/ANCILLARY  NOTES, Brief Assessment, Lab Results, Radiology Results, Assessment/Plan   Last Updated: 08-Feb-16 11:23 by Lujean Amel D (MD)

## 2015-01-27 NOTE — H&P (Signed)
PATIENT NAME:  Jaime Peters, Jaime Peters MR#:  [id number] DATE OF BIRTH:  [birth year]  DATE OF ADMISSION:  11/11/2014  PRIMARY CARE PHYSICIAN: Bari Edward, MD.  CARDIOLOGIST: Lamar Blinks, MD.   CHIEF COMPLAINT: Shortness of breath.   HISTORY OF PRESENT ILLNESS: This is a 81 year old male who presents to the hospital complaining of shortness of breath, progressively getting worse over the past 2 days. The patient was recently discharged from the hospital about a week ago and was treated for Afib and CHF while in the hospital. He now returns with worsening shortness of breath and lower extremity edema. The patient says that he could not sleep all night, as he was significantly short of breath. This morning he called his daughter, who had noticed on the phone that he was significantly out of breath. They came to see him and called EMS and he was brought to the hospital. The patient denies any chest pain. Does admit to some mild chest tightness. He admits to some mild paroxysmal nocturnal dyspnea last night. No palpitations. No syncope. No nausea, no vomiting, no abdominal pain. No other associated symptoms. The patient, when he arrived at triage, was noted to be using accessory muscles and had diffuse crackles. Chest x-ray findings are suggestive of pulmonary edema and CHF. Hospitalist services were contacted for further treatment and evaluation.   REVIEW OF SYSTEMS:  CONSTITUTIONAL: No documented fever. No weight gain or weight loss.   EYES: No blurry or double vision.   ENT: No tinnitus. No postnasal drip. No redness of the oropharynx.   RESPIRATORY: Positive cough. No wheeze. No hemoptysis. Positive dyspnea.   CARDIOVASCULAR: No chest pain, no orthopnea, no palpitations, no syncope.   GASTROINTESTINAL: No nausea, no vomiting. No diarrhea. No abdominal pain. No melena or hematochezia.   GENITOURINARY: No dysuria or hematuria.   ENDOCRINE: No polyuria, nocturia, heat or cold intolerance.    HEMATOLOGY: No anemia. No bruising. No bleeding.   INTEGUMENTARY: No rashes. No lesions.   MUSCULOSKELETAL: No arthritis. No swelling. No gout.   NEUROLOGIC: No numbness. No tingling. No ataxia. No seizure-type activity.   PSYCHIATRIC: No anxiety. No insomnia. No ADD.   PAST MEDICAL HISTORY: Consistent with hypertension, hyperlipidemia, chronic atrial fibrillation, history of CHF, chronic systolic CHF, chronic kidney disease stage 3, GERD.   ALLERGIES: No known drug allergies.   SOCIAL HISTORY: Used to be a smoker, quit many years ago. Does have a 30 pack-year smoking history. No alcohol abuse. No illicit drug abuse. Lives by himself.   FAMILY HISTORY: Mother and father both deceased. Mother died from complications of congestive heart failure. Father died from a stroke.   CURRENT MEDICATIONS: Are as follows: Coreg 6.25 mg b.i.d., hydralazine 25 mg 3 times a day, Imdur 30 mg daily, lansoprazole 30 mg daily, simvastatin 40 mg daily and Xarelto 15 mg daily.   PHYSICAL EXAMINATION: Presently is as follows:  VITAL SIGNS: Are noted to be: Temperature is 97.6, pulse 89, respirations 24, blood pressure 129/83, saturation is 99% on BiPAP.   GENERAL: He is a pleasant-appearing male in mild respiratory distress.   HEAD, EYES, EARS, NOSE, THROAT: Atraumatic, normocephalic. Extraocular muscles are intact. Pupils equal and reactive to light. Sclerae anicteric. No conjunctival injection. No pharyngeal erythema.   NECK: Supple. There is no jugular venous distention. No bruits. No lymphadenopathy. No thyromegaly.   HEART: Exam is irregular. No murmurs, no rubs, no clicks.   LUNGS: He has some coarse bibasilar crackles. Positive use of accessory muscles. No  dullness to percussion.   ABDOMEN: Soft, flat, nontender, nondistended. Has good bowel sounds. No hepatosplenomegaly appreciated.   EXTREMITIES: No evidence of cyanosis or clubbing. Does have +2 pitting edema from the knees to the ankles  bilaterally, +2 pedal and radial pulses bilaterally.   NEUROLOGIC: The patient is alert, awake and oriented x 3 with no focal motor or sensory deficits appreciated bilaterally.   SKIN: Moist and warm with no rashes appreciated.   LYMPHATIC: There is no cervical or axillary lymphadenopathy.   LABORATORY DATA: Showed a serum glucose of 116, BUN 49, creatinine 1.6, sodium 136, potassium 5.7, chloride 103, bicarbonate 25. LFTs are within normal limits. Troponin less than 0.02. White cell count 10.6, hemoglobin 11.4, hematocrit 34.5, platelet count of 251,000. INR is 2.1.   DIAGNOSTIC DATA: Chest x-ray findings showing worsening interstitial pulmonary edema and small bilateral pleural effusions.   ASSESSMENT AND PLAN: This is a 81 year old male with history of chronic atrial fibrillation, hypertension, history of congestive heart failure, gastroesophageal reflux disease, hyperlipidemia, chronic kidney disease stage 3, who presents to the hospital with shortness of breath and noted to be in congestive heart failure.  1. Acute congestive heart failure. This is likely acute on chronic systolic congestive heart failure. The patient has a history of severe cardiomyopathy, ejection fraction of 20% to 25%. The patient apparently was not on diuretics prior to being discharged a week ago. For now, I will diurese him with intravenous Lasix, follow ins and outs and daily weights. Continue his Coreg. He is not on an angiotensin converting enzyme inhibitor due to his chronic kidney disease. Continue Imdur and hydralazine. We will get a cardiology consult.  2. Chronic atrial fibrillation. The patient's rates are currently labile due to his shortness of breath. I will continue Coreg, add some as needed Cardizem for heart rates greater than 130 and continue his Xarelto.  3. Chronic kidney disease stage 3. His creatinine is close to baseline and we will follow his BUN and creatinine with diuresis. The patient likely needs  an outpatient nephrology referral.  4. Gastroesophageal reflux disease. Continue Protonix.  5. Hyperlipidemia. Continue simvastatin.  6. Hypertension. Continue Coreg, hydralazine and Imdur.   CODE STATUS: The patient is a full code.   TIME SPENT ON ADMISSION: 50 minutes.   ____________________________ Rolly PancakeVivek J. Cherlynn KaiserSainani, MD vjs:TT D: 11/11/2014 10:52:40 ET T: 11/11/2014 11:14:31 ET JOB#: [id number]  cc: Rolly PancakeVivek J. Cherlynn KaiserSainani, MD,   [place] SirenVIVEK J Sira Adsit MD ELECTRONICALLY SIGNED 11/28/2014 15:28

## 2015-01-27 DEATH — deceased

## 2015-08-01 ENCOUNTER — Other Ambulatory Visit: Payer: Self-pay | Admitting: Internal Medicine

## 2015-08-01 ENCOUNTER — Encounter: Payer: Self-pay | Admitting: Internal Medicine

## 2015-08-01 DIAGNOSIS — I482 Chronic atrial fibrillation, unspecified: Secondary | ICD-10-CM | POA: Insufficient documentation

## 2015-08-01 DIAGNOSIS — J309 Allergic rhinitis, unspecified: Secondary | ICD-10-CM | POA: Insufficient documentation

## 2015-08-01 DIAGNOSIS — Z8711 Personal history of peptic ulcer disease: Secondary | ICD-10-CM | POA: Insufficient documentation

## 2015-08-01 DIAGNOSIS — I5021 Acute systolic (congestive) heart failure: Secondary | ICD-10-CM | POA: Insufficient documentation

## 2015-08-01 DIAGNOSIS — R600 Localized edema: Secondary | ICD-10-CM | POA: Insufficient documentation

## 2015-08-01 DIAGNOSIS — D509 Iron deficiency anemia, unspecified: Secondary | ICD-10-CM | POA: Insufficient documentation

## 2015-08-01 DIAGNOSIS — I420 Dilated cardiomyopathy: Secondary | ICD-10-CM | POA: Insufficient documentation

## 2015-08-01 DIAGNOSIS — E785 Hyperlipidemia, unspecified: Secondary | ICD-10-CM | POA: Insufficient documentation

## 2015-08-01 DIAGNOSIS — I10 Essential (primary) hypertension: Secondary | ICD-10-CM | POA: Insufficient documentation

## 2015-08-01 DIAGNOSIS — F5101 Primary insomnia: Secondary | ICD-10-CM | POA: Insufficient documentation

## 2015-08-01 DIAGNOSIS — R0902 Hypoxemia: Secondary | ICD-10-CM | POA: Insufficient documentation

## 2015-08-01 DIAGNOSIS — N289 Disorder of kidney and ureter, unspecified: Secondary | ICD-10-CM | POA: Insufficient documentation

## 2015-08-01 DIAGNOSIS — M159 Polyosteoarthritis, unspecified: Secondary | ICD-10-CM | POA: Insufficient documentation

## 2015-08-01 DIAGNOSIS — I495 Sick sinus syndrome: Secondary | ICD-10-CM | POA: Insufficient documentation

## 2015-10-16 IMAGING — CR DG CHEST 1V PORT
1 series · 2 of 2 positions shown · non-contrast
Comparison: 11/03/2014

CLINICAL DATA: Shortness of breath. Recently discharged from the
hospital for congestive heart failure.

EXAM:
PORTABLE CHEST - 1 VIEW

[Series 1: ap · 0.17mm/px · 2 of 2 slices shown]
[im 1/2]
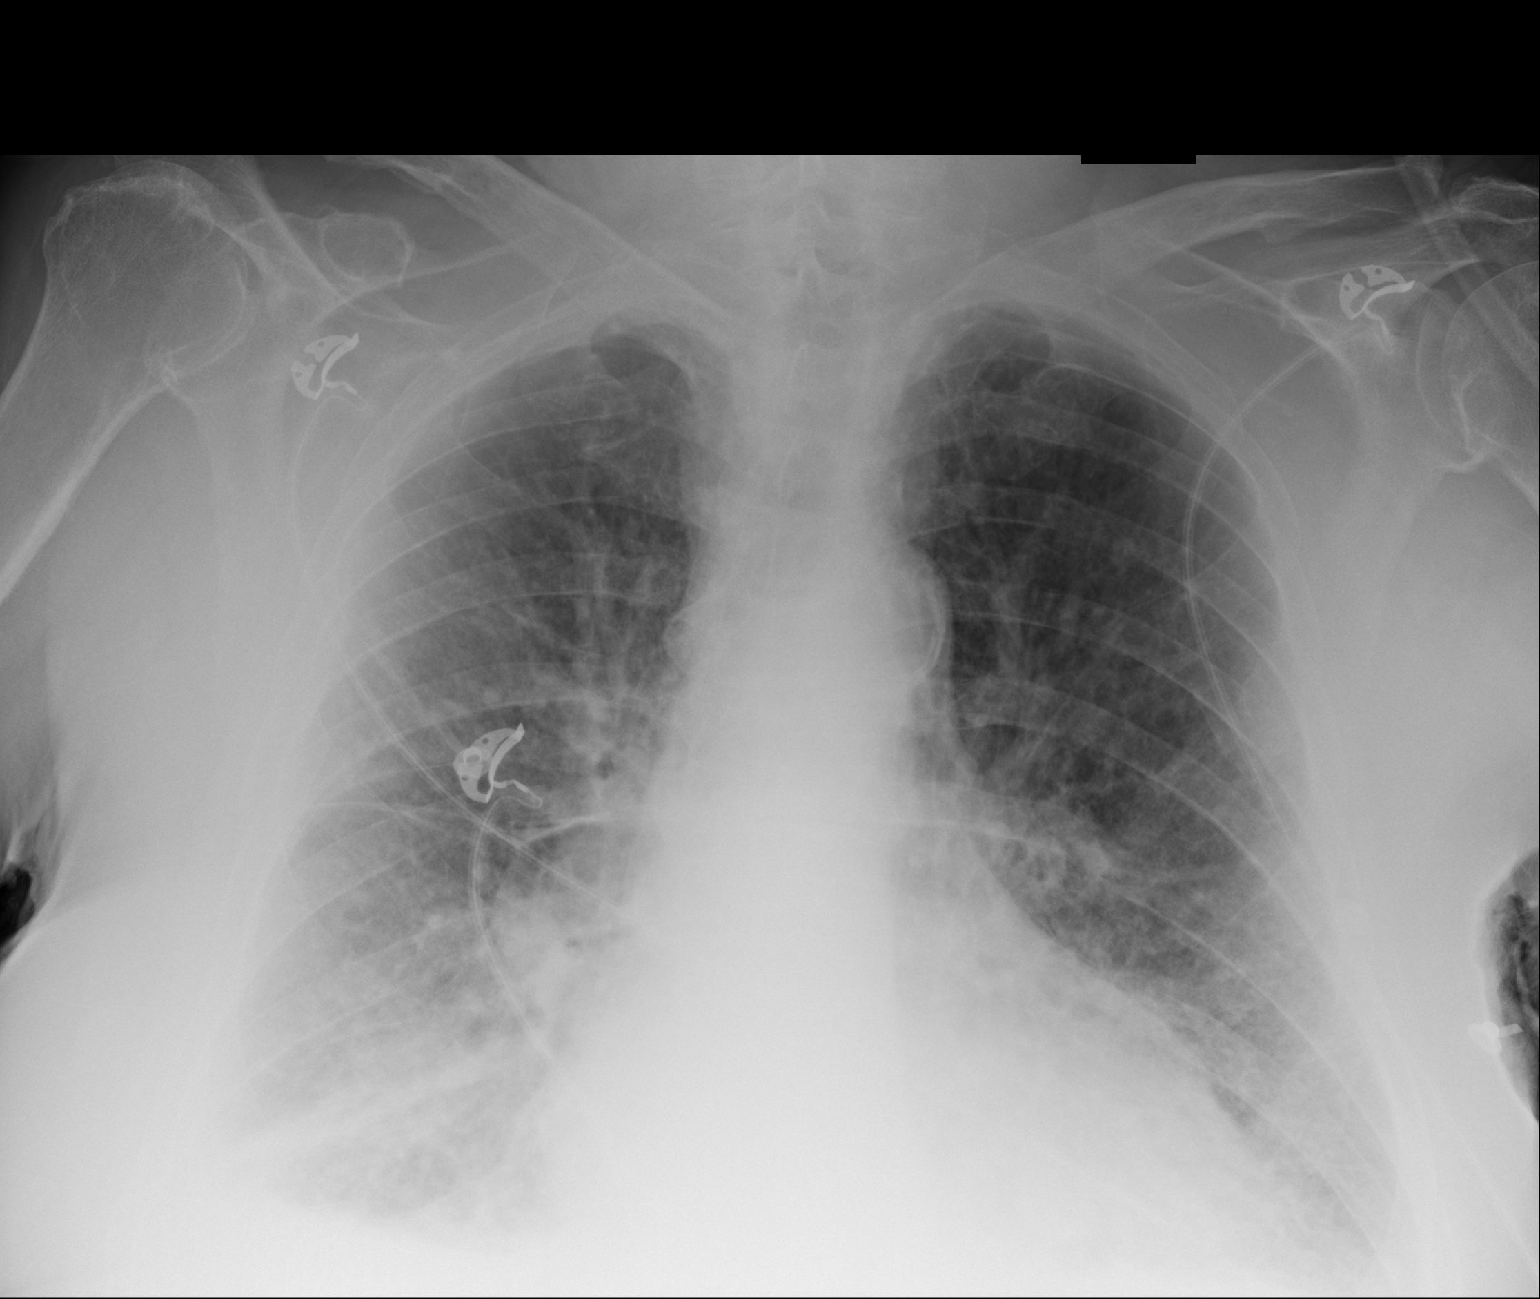
[im 2/2]
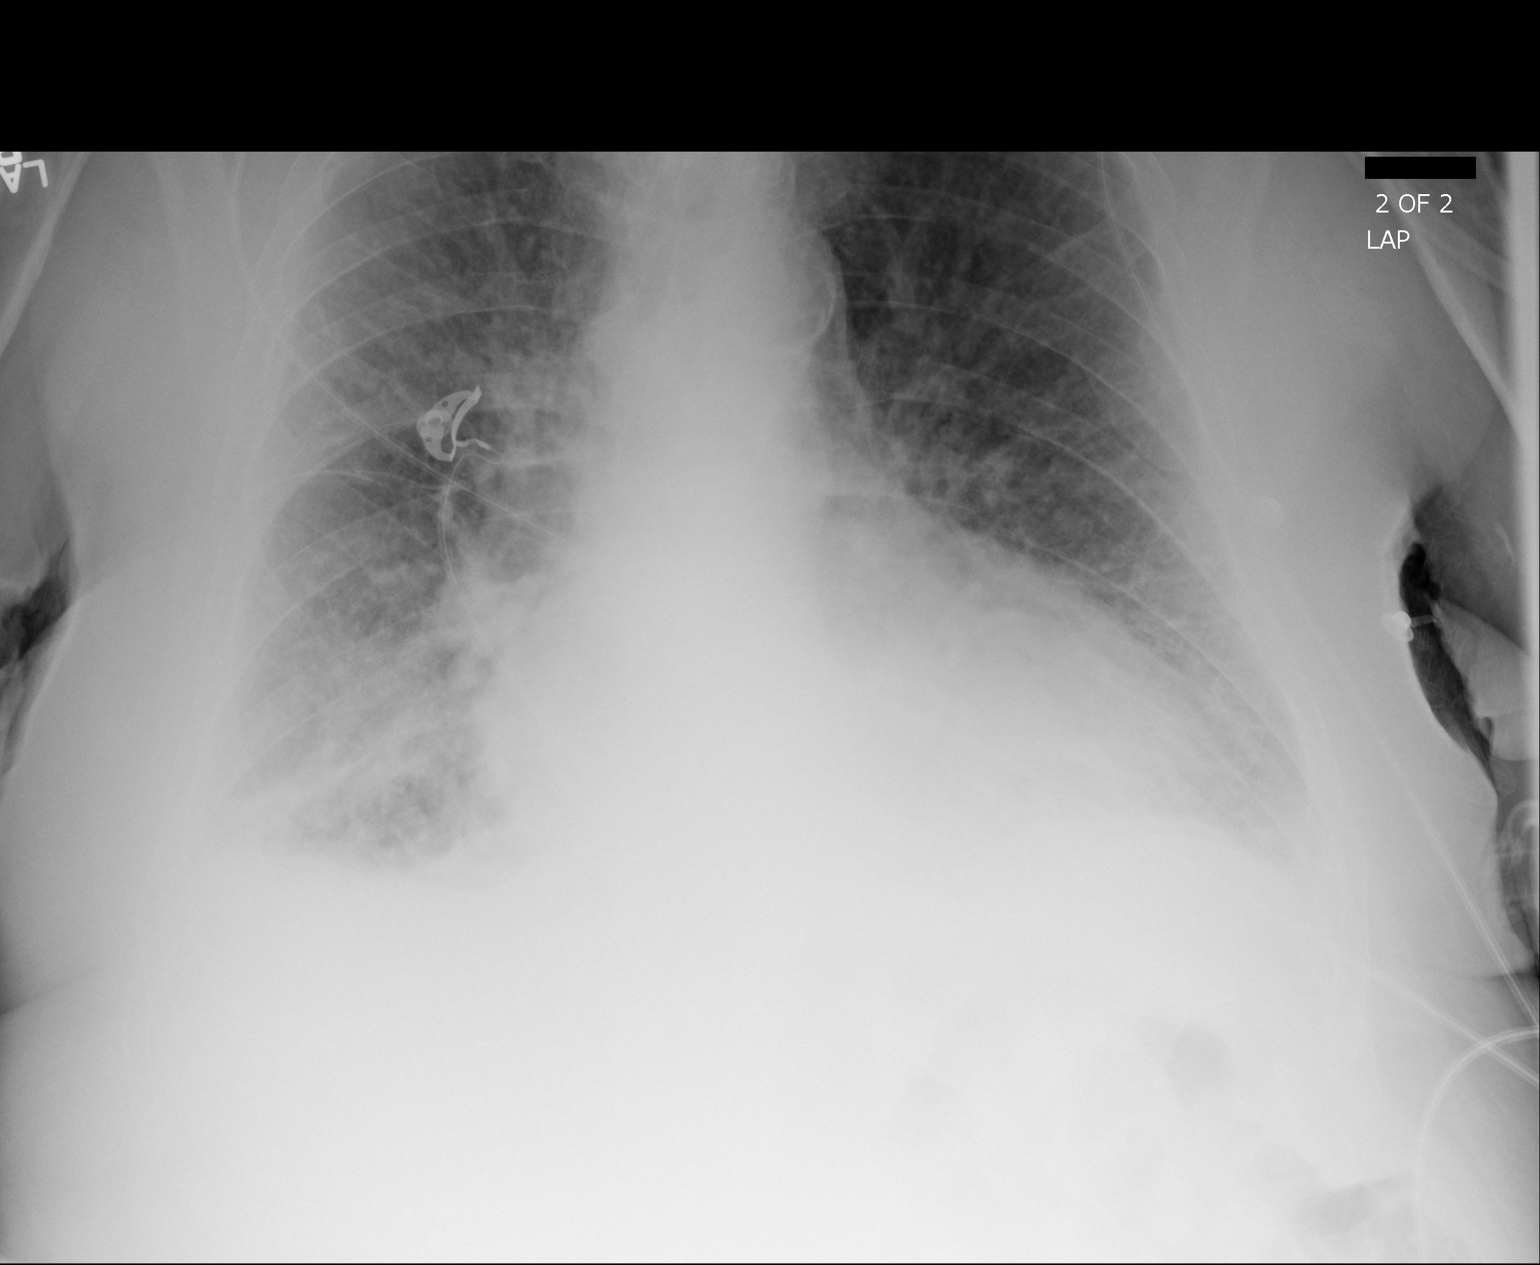

[2 of 2 positions shown; findings below may reference images not displayed]

FINDINGS: Monitoring leads overlie the patient. Stable cardiomegaly. Interval
development of bilateral mid and lower lung interstitial pulmonary
opacities. Probable small bilateral pleural effusions.
IMPRESSION: Cardiomegaly.

Worsening interstitial pulmonary edema and small bilateral pleural
effusions.

## 2015-11-21 IMAGING — CT CT HEAD WITHOUT CONTRAST
1 series · 16 of 30 positions shown, 20 images · non-contrast
Comparison: None.

CLINICAL DATA: Altered mental status.

EXAM:
CT HEAD WITHOUT CONTRAST
TECHNIQUE: Contiguous axial images were obtained from the base of the skull
through the vertex without intravenous contrast.

[Series 2: head wo · axial · 0.44mm/px · z∈[+447,+591]mm · 16 of 36 slices shown, 20 images]
[im 2/36  brain]
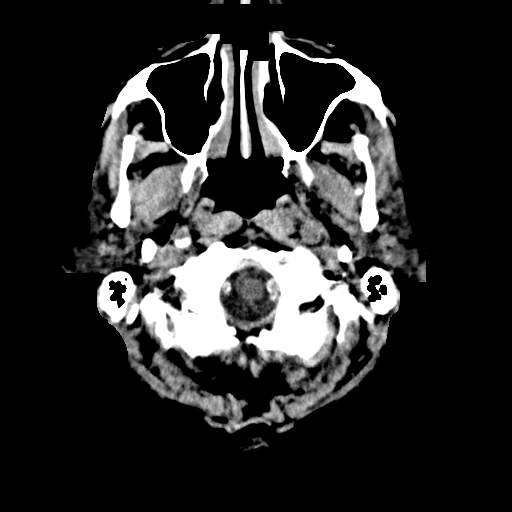
[im 2/36  bone]
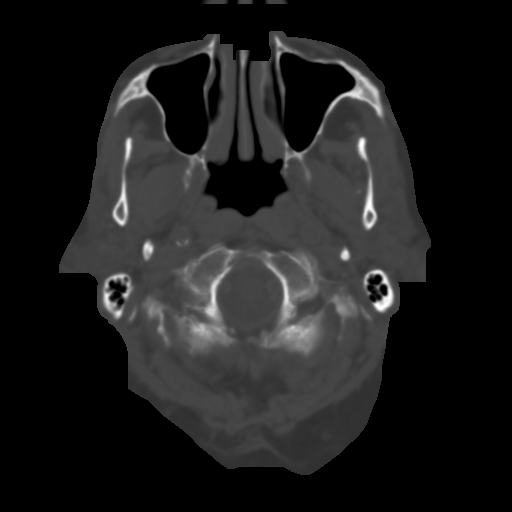
[im 4/36  brain]
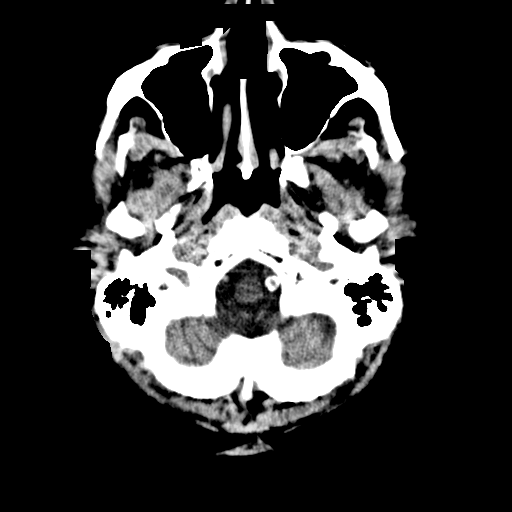
[im 7/36  brain]
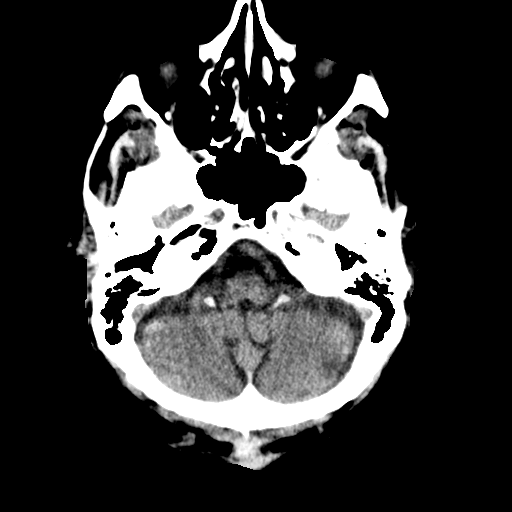
[im 9/36  brain]
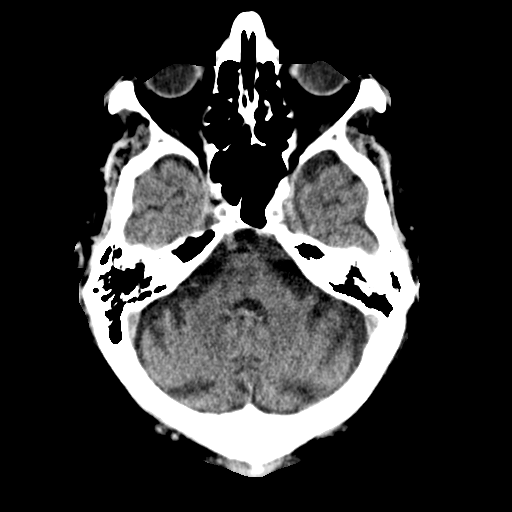
[im 10/36  brain]
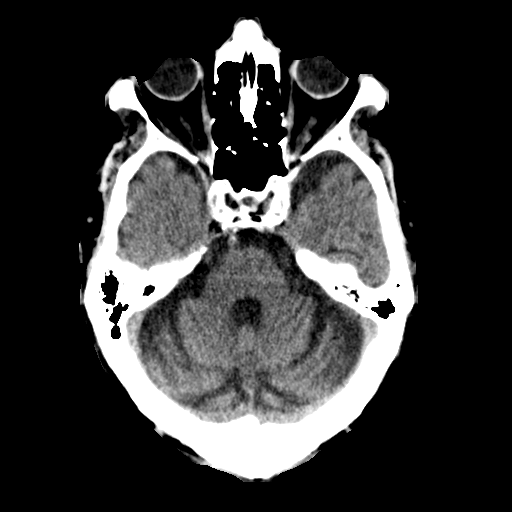
[im 10/36  bone]
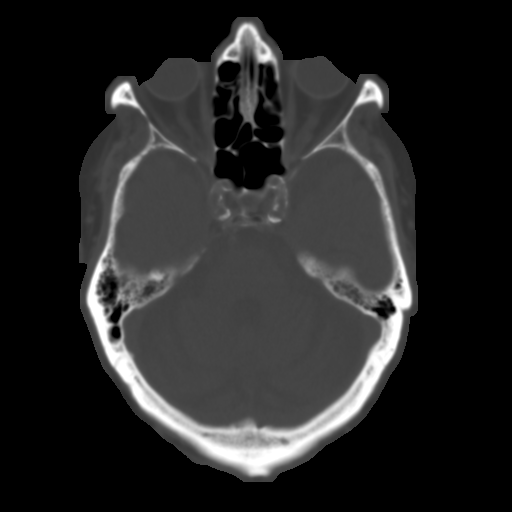
[im 13/36  brain]
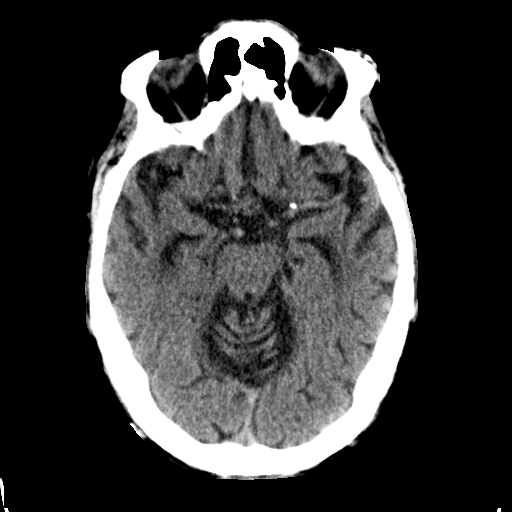
[im 15/36  brain]
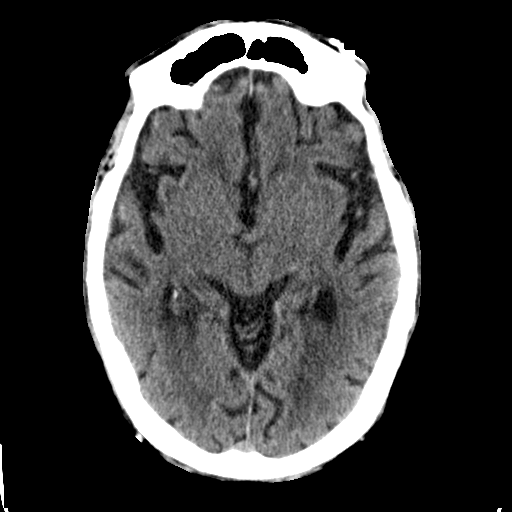
[im 17/36  brain]
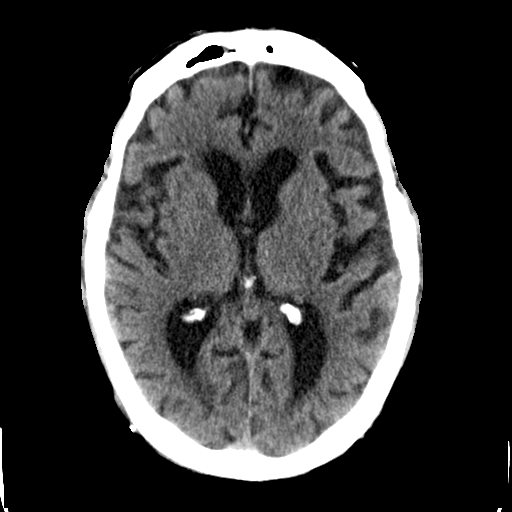
[im 19/36  brain]
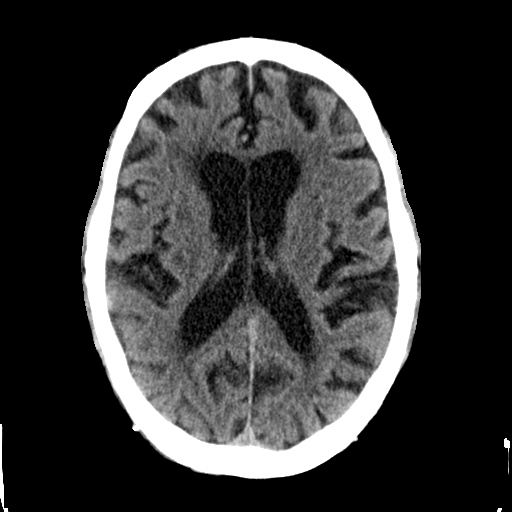
[im 19/36  bone]
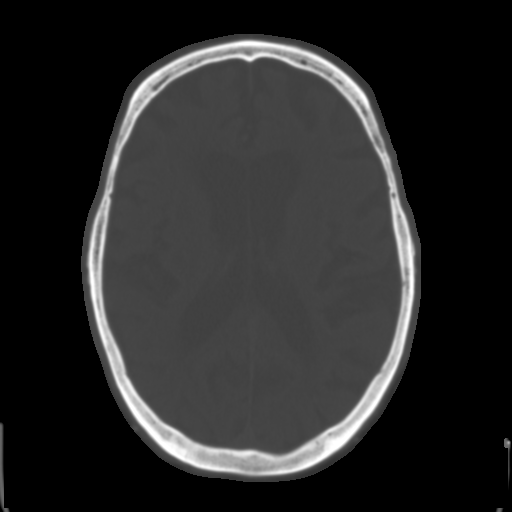
[im 21/36  brain]
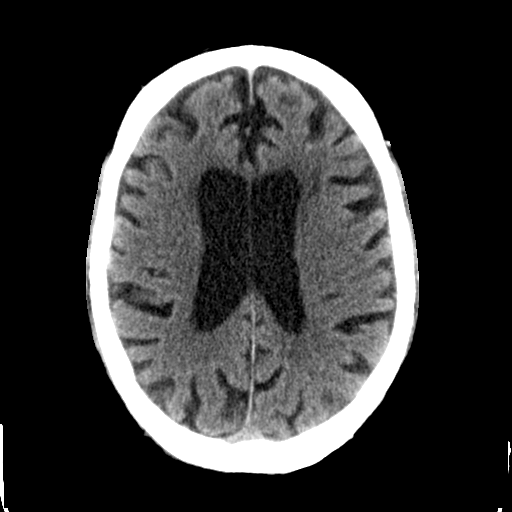
[im 23/36  brain]
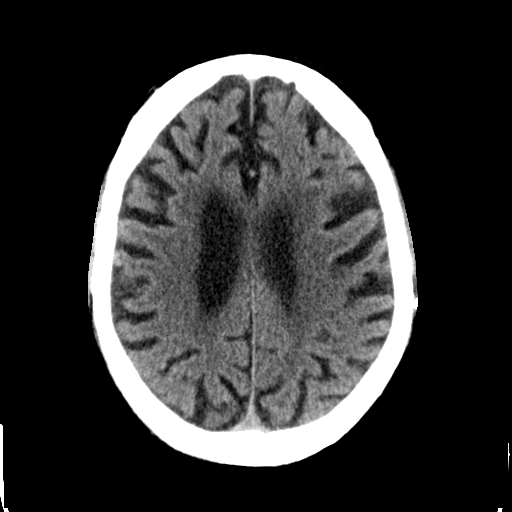
[im 26/36  brain]
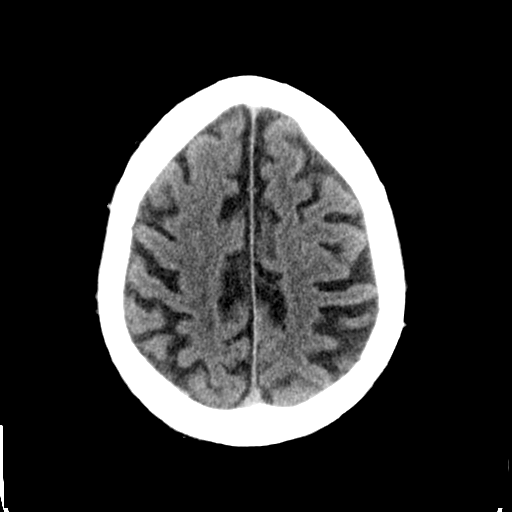
[im 27/36  brain]
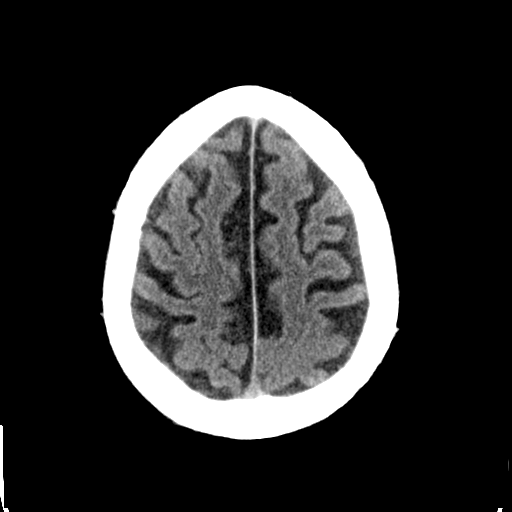
[im 27/36  bone]
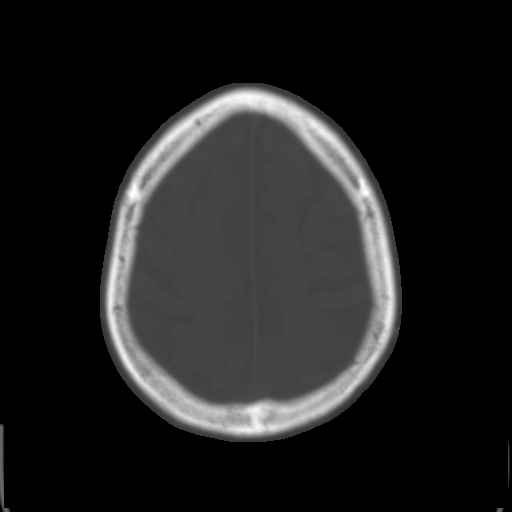
[im 29/36  brain]
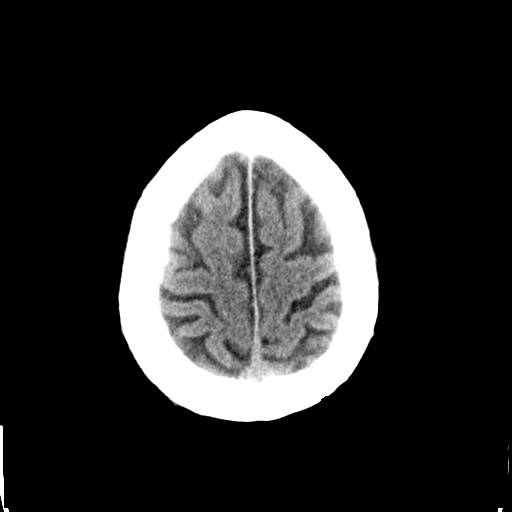
[im 32/36  brain]
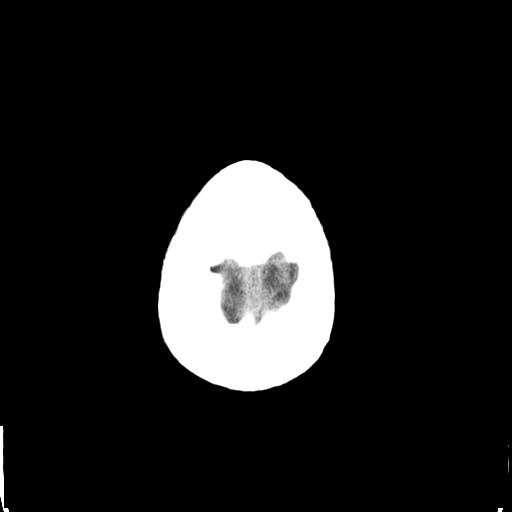
[im 34/36  brain]
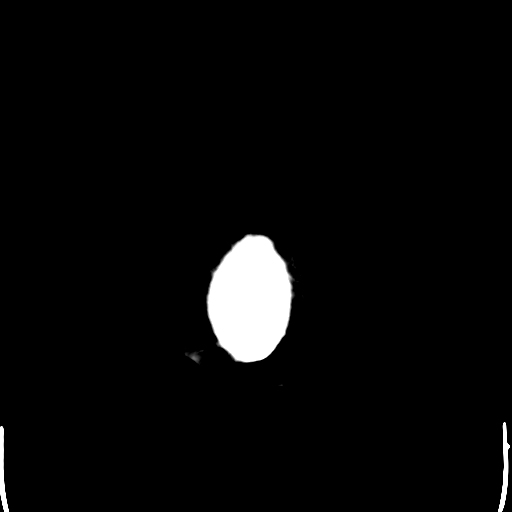

[16 of 30 positions shown; findings below may reference images not displayed]

FINDINGS: The brain demonstrates diffuse cortical atrophy is well as moderate
small vessel ischemic changes in the periventricular white matter
and probable old lacunes in the left basal ganglia and in the region
of the right external capsule/ corona radiata. The brain
demonstrates no evidence of hemorrhage, acute infarction, edema,
mass effect, extra-axial fluid collection, hydrocephalus or mass
lesion. The skull is unremarkable.
IMPRESSION: No acute findings. Cortical atrophy, small vessel disease and old
lacunar infarcts identified.
